# Patient Record
Sex: Female | Born: 1960 | Race: White | Hispanic: No | Marital: Single | Smoking: Current every day smoker
Health system: Southern US, Community
[De-identification: ages and names within clinical notes are randomized; demographics above are authoritative.]

## PROBLEM LIST (undated history)

## (undated) DIAGNOSIS — H9190 Unspecified hearing loss, unspecified ear: Secondary | ICD-10-CM

## (undated) DIAGNOSIS — Z974 Presence of external hearing-aid: Secondary | ICD-10-CM

## (undated) DIAGNOSIS — I10 Essential (primary) hypertension: Secondary | ICD-10-CM

---

## 2000-05-05 ENCOUNTER — Emergency Department (HOSPITAL_COMMUNITY): Admission: EM | Admit: 2000-05-05 | Discharge: 2000-05-06 | Payer: Self-pay | Admitting: Emergency Medicine

## 2001-01-09 ENCOUNTER — Emergency Department (HOSPITAL_COMMUNITY): Admission: EM | Admit: 2001-01-09 | Discharge: 2001-01-09 | Payer: Self-pay

## 2001-12-12 ENCOUNTER — Encounter: Payer: Self-pay | Admitting: *Deleted

## 2001-12-12 ENCOUNTER — Emergency Department (HOSPITAL_COMMUNITY): Admission: EM | Admit: 2001-12-12 | Discharge: 2001-12-12 | Payer: Self-pay | Admitting: *Deleted

## 2001-12-15 ENCOUNTER — Emergency Department (HOSPITAL_COMMUNITY): Admission: EM | Admit: 2001-12-15 | Discharge: 2001-12-15 | Payer: Self-pay | Admitting: *Deleted

## 2002-04-16 ENCOUNTER — Other Ambulatory Visit: Admission: RE | Admit: 2002-04-16 | Discharge: 2002-04-16 | Payer: Self-pay | Admitting: Family Medicine

## 2002-04-16 ENCOUNTER — Emergency Department (HOSPITAL_COMMUNITY): Admission: EM | Admit: 2002-04-16 | Discharge: 2002-04-16 | Payer: Self-pay | Admitting: Emergency Medicine

## 2002-04-16 ENCOUNTER — Encounter: Payer: Self-pay | Admitting: Emergency Medicine

## 2002-04-22 ENCOUNTER — Ambulatory Visit (HOSPITAL_COMMUNITY): Admission: RE | Admit: 2002-04-22 | Discharge: 2002-04-22 | Payer: Self-pay | Admitting: Family Medicine

## 2002-04-22 ENCOUNTER — Encounter: Payer: Self-pay | Admitting: Family Medicine

## 2002-04-22 ENCOUNTER — Encounter: Payer: Self-pay | Admitting: Emergency Medicine

## 2002-04-22 ENCOUNTER — Inpatient Hospital Stay (HOSPITAL_COMMUNITY): Admission: EM | Admit: 2002-04-22 | Discharge: 2002-04-24 | Payer: Self-pay | Admitting: Emergency Medicine

## 2002-04-24 ENCOUNTER — Encounter: Payer: Self-pay | Admitting: Infectious Diseases

## 2002-04-28 ENCOUNTER — Emergency Department (HOSPITAL_COMMUNITY): Admission: EM | Admit: 2002-04-28 | Discharge: 2002-04-28 | Payer: Self-pay | Admitting: Emergency Medicine

## 2002-04-28 ENCOUNTER — Encounter: Payer: Self-pay | Admitting: Emergency Medicine

## 2002-04-30 ENCOUNTER — Inpatient Hospital Stay (HOSPITAL_COMMUNITY): Admission: EM | Admit: 2002-04-30 | Discharge: 2002-05-06 | Payer: Self-pay | Admitting: Emergency Medicine

## 2002-04-30 ENCOUNTER — Encounter: Payer: Self-pay | Admitting: Emergency Medicine

## 2002-05-03 ENCOUNTER — Encounter (INDEPENDENT_AMBULATORY_CARE_PROVIDER_SITE_OTHER): Payer: Self-pay | Admitting: *Deleted

## 2002-05-04 ENCOUNTER — Encounter: Payer: Self-pay | Admitting: *Deleted

## 2002-05-04 ENCOUNTER — Encounter: Payer: Self-pay | Admitting: Family Medicine

## 2002-05-06 ENCOUNTER — Encounter: Admission: RE | Admit: 2002-05-06 | Discharge: 2002-05-06 | Payer: Self-pay | Admitting: Family Medicine

## 2003-02-10 ENCOUNTER — Inpatient Hospital Stay (HOSPITAL_COMMUNITY): Admission: EM | Admit: 2003-02-10 | Discharge: 2003-02-12 | Payer: Self-pay | Admitting: Psychiatry

## 2003-10-10 ENCOUNTER — Emergency Department (HOSPITAL_COMMUNITY): Admission: EM | Admit: 2003-10-10 | Discharge: 2003-10-10 | Payer: Self-pay | Admitting: Family Medicine

## 2003-10-11 ENCOUNTER — Emergency Department (HOSPITAL_COMMUNITY): Admission: EM | Admit: 2003-10-11 | Discharge: 2003-10-11 | Payer: Self-pay | Admitting: Family Medicine

## 2003-10-12 ENCOUNTER — Emergency Department (HOSPITAL_COMMUNITY): Admission: EM | Admit: 2003-10-12 | Discharge: 2003-10-12 | Payer: Self-pay | Admitting: Unknown Physician Specialty

## 2003-10-14 ENCOUNTER — Emergency Department (HOSPITAL_COMMUNITY): Admission: EM | Admit: 2003-10-14 | Discharge: 2003-10-14 | Payer: Self-pay | Admitting: Family Medicine

## 2003-10-25 ENCOUNTER — Emergency Department (HOSPITAL_COMMUNITY): Admission: EM | Admit: 2003-10-25 | Discharge: 2003-10-25 | Payer: Self-pay

## 2004-01-11 ENCOUNTER — Encounter: Admission: RE | Admit: 2004-01-11 | Discharge: 2004-01-11 | Payer: Self-pay | Admitting: Internal Medicine

## 2004-05-07 ENCOUNTER — Emergency Department (HOSPITAL_COMMUNITY): Admission: EM | Admit: 2004-05-07 | Discharge: 2004-05-07 | Payer: Self-pay | Admitting: Family Medicine

## 2004-05-09 ENCOUNTER — Emergency Department (HOSPITAL_COMMUNITY): Admission: EM | Admit: 2004-05-09 | Discharge: 2004-05-09 | Payer: Self-pay | Admitting: Family Medicine

## 2004-05-11 ENCOUNTER — Emergency Department (HOSPITAL_COMMUNITY): Admission: EM | Admit: 2004-05-11 | Discharge: 2004-05-12 | Payer: Self-pay | Admitting: Emergency Medicine

## 2004-05-15 ENCOUNTER — Ambulatory Visit: Payer: Self-pay | Admitting: Family Medicine

## 2004-05-23 ENCOUNTER — Emergency Department (HOSPITAL_COMMUNITY): Admission: EM | Admit: 2004-05-23 | Discharge: 2004-05-23 | Payer: Self-pay | Admitting: Family Medicine

## 2004-08-03 ENCOUNTER — Emergency Department (HOSPITAL_COMMUNITY): Admission: EM | Admit: 2004-08-03 | Discharge: 2004-08-03 | Payer: Self-pay | Admitting: Family Medicine

## 2004-09-10 ENCOUNTER — Emergency Department (HOSPITAL_COMMUNITY): Admission: EM | Admit: 2004-09-10 | Discharge: 2004-09-10 | Payer: Self-pay | Admitting: Family Medicine

## 2005-02-04 ENCOUNTER — Emergency Department (HOSPITAL_COMMUNITY): Admission: EM | Admit: 2005-02-04 | Discharge: 2005-02-04 | Payer: Self-pay | Admitting: Family Medicine

## 2005-02-06 ENCOUNTER — Emergency Department (HOSPITAL_COMMUNITY): Admission: EM | Admit: 2005-02-06 | Discharge: 2005-02-06 | Payer: Self-pay | Admitting: Emergency Medicine

## 2005-05-14 ENCOUNTER — Emergency Department (HOSPITAL_COMMUNITY): Admission: EM | Admit: 2005-05-14 | Discharge: 2005-05-14 | Payer: Self-pay | Admitting: Family Medicine

## 2005-06-24 ENCOUNTER — Emergency Department (HOSPITAL_COMMUNITY): Admission: EM | Admit: 2005-06-24 | Discharge: 2005-06-24 | Payer: Self-pay | Admitting: Emergency Medicine

## 2005-07-01 ENCOUNTER — Ambulatory Visit: Payer: Self-pay | Admitting: Internal Medicine

## 2005-07-13 ENCOUNTER — Emergency Department (HOSPITAL_COMMUNITY): Admission: EM | Admit: 2005-07-13 | Discharge: 2005-07-13 | Payer: Self-pay | Admitting: Family Medicine

## 2005-07-15 ENCOUNTER — Ambulatory Visit: Payer: Self-pay | Admitting: Internal Medicine

## 2005-08-15 ENCOUNTER — Emergency Department (HOSPITAL_COMMUNITY): Admission: EM | Admit: 2005-08-15 | Discharge: 2005-08-15 | Payer: Self-pay | Admitting: Family Medicine

## 2005-10-05 ENCOUNTER — Emergency Department (HOSPITAL_COMMUNITY): Admission: EM | Admit: 2005-10-05 | Discharge: 2005-10-05 | Payer: Self-pay | Admitting: Emergency Medicine

## 2005-10-22 ENCOUNTER — Emergency Department (HOSPITAL_COMMUNITY): Admission: EM | Admit: 2005-10-22 | Discharge: 2005-10-22 | Payer: Self-pay | Admitting: Family Medicine

## 2005-10-28 ENCOUNTER — Emergency Department (HOSPITAL_COMMUNITY): Admission: EM | Admit: 2005-10-28 | Discharge: 2005-10-28 | Payer: Self-pay | Admitting: Emergency Medicine

## 2006-07-08 ENCOUNTER — Telehealth: Payer: Self-pay | Admitting: Internal Medicine

## 2006-08-04 ENCOUNTER — Telehealth (INDEPENDENT_AMBULATORY_CARE_PROVIDER_SITE_OTHER): Payer: Self-pay | Admitting: *Deleted

## 2006-08-08 ENCOUNTER — Telehealth (INDEPENDENT_AMBULATORY_CARE_PROVIDER_SITE_OTHER): Payer: Self-pay | Admitting: *Deleted

## 2007-09-25 ENCOUNTER — Emergency Department (HOSPITAL_COMMUNITY): Admission: EM | Admit: 2007-09-25 | Discharge: 2007-09-25 | Payer: Self-pay | Admitting: Emergency Medicine

## 2008-05-21 ENCOUNTER — Emergency Department (HOSPITAL_COMMUNITY): Admission: EM | Admit: 2008-05-21 | Discharge: 2008-05-21 | Payer: Self-pay | Admitting: Family Medicine

## 2008-06-12 ENCOUNTER — Emergency Department (HOSPITAL_COMMUNITY): Admission: EM | Admit: 2008-06-12 | Discharge: 2008-06-12 | Payer: Self-pay | Admitting: Emergency Medicine

## 2008-11-26 ENCOUNTER — Emergency Department (HOSPITAL_BASED_OUTPATIENT_CLINIC_OR_DEPARTMENT_OTHER): Admission: EM | Admit: 2008-11-26 | Discharge: 2008-11-26 | Payer: Self-pay | Admitting: Emergency Medicine

## 2010-10-26 ENCOUNTER — Encounter: Payer: Self-pay | Admitting: Emergency Medicine

## 2010-10-26 ENCOUNTER — Other Ambulatory Visit: Payer: Self-pay | Admitting: Emergency Medicine

## 2010-10-26 ENCOUNTER — Ambulatory Visit
Admission: RE | Admit: 2010-10-26 | Discharge: 2010-10-26 | Disposition: A | Discharge status: Home / Self Care | Payer: Medicare Other | Source: Ambulatory Visit | Attending: Emergency Medicine | Admitting: Emergency Medicine

## 2010-10-26 ENCOUNTER — Inpatient Hospital Stay (INDEPENDENT_AMBULATORY_CARE_PROVIDER_SITE_OTHER)
Admission: RE | Admit: 2010-10-26 | Discharge: 2010-10-26 | Disposition: A | Discharge status: Home / Self Care | Payer: Medicare Other | Source: Ambulatory Visit | Attending: Emergency Medicine | Admitting: Emergency Medicine

## 2010-10-26 DIAGNOSIS — S82899A Other fracture of unspecified lower leg, initial encounter for closed fracture: Secondary | ICD-10-CM | POA: Insufficient documentation

## 2010-10-26 DIAGNOSIS — M25579 Pain in unspecified ankle and joints of unspecified foot: Secondary | ICD-10-CM

## 2010-10-26 DIAGNOSIS — I10 Essential (primary) hypertension: Secondary | ICD-10-CM | POA: Insufficient documentation

## 2010-10-27 DIAGNOSIS — S82899A Other fracture of unspecified lower leg, initial encounter for closed fracture: Secondary | ICD-10-CM

## 2010-11-02 NOTE — Discharge Summary (Signed)
NAMEMarland Kitchen  Shelby Mcdonald, Shelby Mcdonald NO.:  [id number]   MEDICAL RECORD NO.:  [id number]                   PATIENT TYPE:  INP   LOCATION:  5725                                 FACILITY:  [facility]   PHYSICIAN:  Nani Gasser, M.D.            DATE OF BIRTH:  Mar 21, 1961   DATE OF ADMISSION:  04/30/2002  DATE OF DISCHARGE:  05/06/2002                                 DISCHARGE SUMMARY   DISCHARGE DIAGNOSES:  1. Chronic right lower quadrant pain.  2. Constipation x5 weeks.  3. Bilateral neural hearing loss.  4. Depression.   DISCHARGE MEDICATIONS:  1. Colace 100 mg 1 p.o. b.i.d.  2. Simethicone 80 mg 1 p.o. q.d. to q.i.d. p.r.n. for gas.  3. Protonix 40 mg p.o. q.d.  4. Ultracet 2 tablets q.4-6h. p.r.n. pain.  5. MiraLax 1 teaspoon in 8 ounces of water q.d. p.r.n. constipation.   DISCHARGE DIET:  Should include high fiber and increased intake of water and  Metamucil.   FOLLOW UP:  The patient should follow up with Dr. Barbaraann Barthel at [facility] in  the next couple of weeks.   HOSPITAL COURSE:  1. Right lower quadrant pain.  The patient has been previously admitted for     the same problem, appendicitis was ruled out by CT, strictures were less     likely with CT.  The patient did have significant stool in the right side     of her colon.  The patient has been switched from her amitriptyline to     desipramine which has less anticholinergic effects and patient returned     with still intense pain.  Dr. Virginia Rochester from gastroenterology was consulted.     Dr. Virginia Rochester performed colonoscopy which was negative, though biopsy was     taken.  Also performed small bowel enterocolitis which was also negative.     He also did an abdominal ultrasound that did show a gallstone, but no     cholecystitis.  He also did a pelvic ultrasound indicating that the     patient did have a cyst on her right ovary that was 1.9 cm in size, but     this is less likely the cause of her intense abdominal  pain and     constipation.  After this extensive workup we consulted surgery, Dr.     Derrell Lolling, to consider a diagnostic laparoscopy and possible laparoscopy for     a cholecystectomy and appendectomy, but it was felt that he did not     advise surgical intervention at this time, per patient's history and     diagnostic findings.  Thus the patient was discharged home to follow up     with Dr. Luciana Axe, who would rather just see if she improves on her own     before considering surgery, but also decided to discontinue her     desipramine with a taper and see if this  improves her constipation.  She     was also placed on a bowel regimen including Colace, Metamucil, increased     high fiber diet and simethicone for gas, as well as MiraLax for severe     constipation.  2. Constipation x5 weeks.  See regimen above.  Also consider irritable bowel     syndrome, though the patient does not have intermittent periods of     diarrhea.  The patient could benefit from an antidepressant if she does     have irritable bowel, but would like to avoid anticholinergics.  3. Bilateral neural hearing loss.  The patient has a hearing aid on right.     No further workup of this problem during hospitalization.  4. Depression.  The patient is encouraged that she will do well off of her     desipramine.  I encouraged her that if she did feel like she was     beginning to have recurrent depressive symptoms, that she should advise     her physician.  5. Gastroesophageal reflux disease.  The patient should continue on her     Protonix.  Her H. Pylori was negative.                                               Nani Gasser, M.D.    CM/MEDQ  D:  05/06/2002  T:  05/06/2002  Job:  [id number]

## 2010-11-02 NOTE — Discharge Summary (Signed)
NAMEMarland Kitchen  Shelby Mcdonald, Shelby Mcdonald NO.:  [id number]   MEDICAL RECORD NO.:  [id number]                   PATIENT TYPE:  IPS   LOCATION:  0303                                 FACILITY:  [facility]   PHYSICIAN:  Jasmine Pang, M.D.              DATE OF BIRTH:  March 25, 1961   DATE OF ADMISSION:  02/10/2003  DATE OF DISCHARGE:  02/11/2003                                 DISCHARGE SUMMARY   BRIEF REASON FOR ADMISSION:  Patient was a 50 year old female who was  admitted on a voluntary basis due to major depression with suicidal  ideation.  She had overdosed on twenty 10 mg Ambiens, four to five Xanax and  two to three BC tablets; she states this was a suicide attempt, though she  reversed herself on this after admission.  Her grandson died on the weekend  of admission and her family had been arguing.  She denies any substance  abuse.  She has no homicidal ideation, no auditory or visual hallucination  and no delusions.   MEDICATIONS:  1. Effexor XR 75 mg two q.a.m.  2. Ambien 10 mg nightly.  3. Xanax 0.5 mg p.r.n.  4. Protonix 40 mg p.o. daily.   PAST MEDICAL HISTORY:  She has no significant medical problems.  She does  take some medication for hypertension and occasionally has shortness of  breath related to anxiety attacks.  Please see initial psychiatric  evaluation for more information.   PHYSICAL EXAM:  See physical exam done in the emergency room prior to  transfer to our unit.   LABORATORY TESTS:  Most of this was done in the emergency room, see these  records for details.  There were no significant abnormalities.  A  hypothyroid panel was done upon admission to our unit; it was grossly within  normal limits with a TSH of 2.145 (0.350 to 5.50).   HOSPITAL CARE SUMMARY:  Upon admission, the patient was begun on Xanax 0.5  mg, one now then repeat in 30 minutes if needed.  She was also placed on  Demerol 50 mg IM and Phenergan 25 mg IM now for a migraine  headache.  In  addition, she was placed on a nicotine patch 21 mg daily.  On February 11, 2003, her Demerol was discontinued.  She was placed on Imitrex 50 mg two  tablets now and may repeat p.r.n. headache in two hours, a maximum dose of  200 mg in 24 hours.  She was also placed on Fioricet one to two tablets  every (q) 4 hours as needed (p.r.n.) headache, not to exceed six tablets in  24 hours.  On February 11, 2003, she was started on Ativan 2 mg p.o. now for  severe anxiety.  She was begun on Seroquel 50 mg p.o. q.6 h. p.r.n.  agitation, atenolol 50 mg p.o. q.a.m., Ativan 2 mg one q.4 h. p.r.n.  agitation, Effexor XR  150 mg p.o. a.m. and 75 mg p.o. every 3 p.m., Protonix  EC 40 mg p.o. q.a.m.  The patient responded to these interventions well.  She was able to interact appropriately in unit therapeutic groups and  activities.  She recovered from her suicidal crisis and wanted badly to be  home with her family during this time after her grandson died; family was  supportive of this also and felt she would be safe.   MENTAL STATUS EXAMINATION AT TIME OF DISCHARGE:  Mood was less depressed.  She was initially angry that day, but then mood improved.  She was sad and  tearful when discussing the death of her grandson.  Otherwise, there was no  psychosis, no suicidal or homicidal ideation, cognitive was within normal  limits for her.  She had a family session with her boyfriend, who felt she  would be safe at home.  She continued to maintain that she had not  deliberately tried to harm herself prior to admission.  Her boyfriend said  he would be willing to monitor her closely.  She was scheduled for followup  at [facility], February 15, 2003, and intended to keep this appointment,  since she is already seen there.   DISCHARGE DIAGNOSES:   AXIS I:  Major depression, recurrent, severe.   AXIS II:  None.   AXES III:  1. Hypertension.  2. Migraine headaches.   AXIS IV:  Severe --  grandson's recent death.   AXIS V:  Global assessment of functioning -- current is 35, highest the past  70.   DISCHARGE MEDICATIONS:  The patient was discharged on:  1. Effexor XR 150 mg p.o. q.a.m. and 75 mg at 3 p.m.  2. Atenolol 50 mg daily.  3. Protonix 40 mg daily.  4. Imitrex as directed.   ACTIVITY LEVEL:  No restrictions.   DIET:  No restrictions.   POST-HOSPITAL CARE PLANS:  The patient will be seen at the [facility]  for followup therapy, February 15, 2003, at 1 p.m.  She will continue to be  seen by her outpatient psychiatric P.A., Dr. Vic Ripper.                                               Jasmine Pang, M.D.    Shelby Mcdonald  D:  04/03/2003  T:  04/04/2003  Job:  [id number]

## 2010-11-02 NOTE — H&P (Signed)
NAME:  Shelby Mcdonald, Shelby Mcdonald                        ACCOUNT NO.:  [id number]   MEDICAL RECORD NO.:  [id number]                   PATIENT TYPE:  INP   LOCATION:  5530                                 FACILITY:  [facility]   PHYSICIAN:  Wayne A. Sheffield Slider, M.D.                 DATE OF BIRTH:  [birth year]   DATE OF ADMISSION:  04/22/2002  DATE OF DISCHARGE:                                HISTORY & PHYSICAL   CHIEF COMPLAINT:  Hurting in right side.   HISTORY OF PRESENT ILLNESS:  Shelby Mcdonald is a 50 year old white female who  came in complaining of pain that initially started on the right side above  the hip approximately three months ago, and then two months, she said the  pain moved more into her right lower quadrant, and patient went to her  primary care doctor and was placed on Septra for a urinary tract infection.  The patient's pain persisted, and thus, she returned to her physician one  week ago and was placed on Cipro for evidently a second urinary tract  infection. The patient did come into the ED after being seen by her primary  care physician, and here had a negative UA, though she did have a CT which  showed a ruptured right ovarian cyst and possible ileus and cecum colon and  colon of full of stool. The patient says that she also returned to her own  private physician on today and was started on a different antibiotic of  which she did not know the name. The patient reports that she has also had  fever for the past three days and started feeling nauseated and vomiting  last night. The patient now describes her pain as diffuse across the  abdominal though still worse in the right lower quadrant. She describes it  as a sharp constant pain, at its worse 10/10. She says that she has also  been passing gas for the last six days while passing the flatus will have  spurts of liquid stool but has not had any truly formed or normal-sized  bowel movement for one week. The patient also complains of  having difficulty  initiating her urinary stream, says that she has to massage her lower  abdomen to help herself urinate. The patient was seen by surgery in the  emergency department, and they felt that she did not have a surgical  abdomen. The patient has also had a pelvic exam with a GC and Chlamydia at  her primary care physician which the patient reports as normal and negative.   REVIEW OF SYMPTOMS:  SKIN:  No rash. EYES:  No complaints of blurry vision.  ENT:  Has had a sore throat and blisters in the mouth which she says are  secondary to antibiotics. GENERAL:  The patient says she has gained 9 pounds  in the past two and a half weeks. NEUROLOGICAL:  The patient  says that she  has headaches daily for which she takes 65 to 48 Goody Powders a day.  GENITOURINARY:  No pain with urinating and no burning. Does report having a  cyst on her right ovary. GASTROINTESTINAL:  Occasional problems with  constipation for which she takes MiraLax.   MEDICATIONS:  1. Amitriptyline 100 mg p.o. q.h.s.  2. Protonix 40 mg p.o. q.d.  3. Lidocaine mouthwash for the mouth ulcers.  4. Cipro b.i.d.  5. A new antibiotic for which she did not know the name t.i.d.  6. MiraLax p.r.n.  7. B.C. Powder for headache.   ALLERGIES:  No known drug allergies.   PAST MEDICAL HISTORY:  Has hearing loss bilaterally. She has had nerve  deafness since she was 50 years old. The patient does wear a hearing aid on  the right. She has gastroesophageal reflux disease and depression.   PAST SURGICAL HISTORY:  The patient has had a bilateral tubal ligation.   SOCIAL HISTORY:  The patient lives with her fiance and her daughter. She  stays at home and helps take care of her disabled  husband, and recently,  her mother fell and broke her hip, and she has been taking care of her as  well. The patient says she smokes half a pack per day and drinks no alcohol  and takes no other drugs.   FAMILY HISTORY:  The patient is  adopted, and she does not know her family  history.   PHYSICAL EXAMINATION:  VITAL SIGNS:  Temperature 96.7, blood pressure  147/103 which a second repeat was 138/86, pulse was from 96 to 82,  respirations 22, and oxygen saturations 98 to 99%.  GENERAL:  The patient was well-developed, well-nourished. Could speak  clearly. Does have a hearing aid on the right.  HEENT:  Head is atraumatic and normocephalic. Eyes:  Extraocular movements  intact. Pupils are equal, round, and reactive. White sclerae. Nose:  Without  drainage. Oropharynx:  Clear.  HEART:  Regular, rate, and rhythm with no murmur.  CHEST:  Clear to auscultation bilaterally.  ABDOMEN:  Soft, positive bowel sounds, tender in all four quadrants but no  hepatosplenomegaly.  BACK:  Spine is straight. No CVA tenderness.  NEUROLOGICAL:  Cranial nerves II-XII are intact. Gross movement intact.  EXTREMITIES:  With no edema.   LABORATORY DATA:  Lipase is 33, amylase 83, sodium 135, potassium 4.3,  chloride 103, bicarb 24, BUN 7, creatinine 0.7, glucose of 95. White count  6.2, hemoglobin 12.9, hematocrit 35.6, platelets 379, __________  59%  neutrophils. UA was negative. ESR was 31. Total protein 6.9, albumin 3.6,  AST 26, ALT 19, alkaline phosphatase was 73, total bilirubin 0.6. Abdominal  CT showed a large amount of feces in the cecum and the ascending colon and a  normal appendix.   ASSESSMENT/PLAN:  1. The patient is a 50 year old white female with recent history of     constipation and on narcotic pain management and anticholinergic for     depression. Possibilities include irritable bowel with her intermittent     constipation and abdominal pain, but the pain appears to be much more     acute. The patient may also have some pain from evidence of her right     ovarian cyst rupture, but there were changes on CT that were noted. The    patient will be ruled out for a tubal pregnancy. She had a negative test     one week ago,  but we  will retest her today. It was also thought of     recurrent UTI since she has had a recent history of UTI. Her UA is     negative. The patient has been on antibiotics recently. The patient also     has had appendicitis ruled out since there was no indication on CT exam     and no elevated white blood count and no fever since admission. It is     most likely that patient has ileus secondary to narcotic use and     anticholinergics. She will be given GoLYTELY p.o., and if she cannot     tolerate this, we will place a NG to give the GoLYTELY. We will use a     nonnarcotic, Toradol, for pain. We will at this time hold her Elavil     because of anticholinergic affects. We will consider started her on     Desipramine in its place since it has anticholinergic affects. We will     also give her Phenergan for nausea.  2. Urinary hesitancy. Due to the Elavil. Consider changing to desipramine.     We will also check a post void residual.  3.     Gastroesophageal reflux disease. Continue Protonix IV and encourage the     patient to discontinue using B.C. Powder for her headache. We will also     check a H. pylori.  4. Hearing loss. Will need no management in house.     Nani Gasser, M.D.                  Wayne A. Sheffield Slider, M.D.    CM/MEDQ  D:  04/22/2002  T:  04/23/2002  Job:  [id number]   cc:   Jillyn Hidden A. Rankin, M.D.  [address]  [place]  [place] [address]  Fax: [phone]

## 2010-11-02 NOTE — H&P (Signed)
NAMEMarland Kitchen  Shelby Mcdonald, Shelby Mcdonald                        ACCOUNT NO.:  [id number]   MEDICAL RECORD NO.:  [id number]                   PATIENT TYPE:  INP   LOCATION:  5725                                 FACILITY:  [facility]   PHYSICIAN:  Emmit Alexanders, M.D.                      DATE OF BIRTH:  Apr 12, 1961   DATE OF ADMISSION:  04/30/2002  DATE OF DISCHARGE:                                HISTORY & PHYSICAL   CHIEF COMPLAINT:  Abdominal pain.   HISTORY OF PRESENT ILLNESS:  This is a 50 year old white female who was  admitted to the hospital roughly one week ago with similar complaints of  abdominal pain.  The pain is located in the right lower quadrant.  During  her initial hospitalization, work up included a CT scan, which showed cecum  full of stool.  She was treated with aggressive laxatives with frequent  bowel movements and improvement in her symptoms.  She was therefore  discharged to home on a bowel regimen and instruction to eat a clear  liquid/soft diet.  For the first two days after discharge, the patient  seemed to do well.  However, after two more days, the patient began starting  to have abdominal pain again.  The pain is described as if somebody stabbed  her in the right lower quadrant.  The pain is worse with coughing and  movement.  Associated with this, the patient has been having hyperactive  bowel sounds.  She has had some vomiting as well as some small amounts of  diarrhea.  She also complains of abdominal distention.  She also states she  has been feeling hot and cold chills.  She denies any hematemesis.  Denies  any hematochezia or melena.  Denies any vaginal itching or burning.  Denies  any dysuria.  Denies any hematuria.   PAST MEDICAL HISTORY:  For past medical history please see prior history and  physical.   For medications please see prior discharge summary.   ALLERGIES:  No known drug allergies.   For social history and family history please see prior history and  physical.   VITAL SIGNS:  Temperature is 97.5.  Pulse is 85.  Respiratory rate is 16.  Blood pressure is 102/52.  Oxygen saturation is 97% on room air.  In  general, this is a average adult white female in no acute distress.  She is  pleasant, with her partner today.  HEENT:  Pupils equal, round, and reactive  to light.  No icterus or conjuctivitis.  Extraocular movements are intact.  Oropharynx is without erythema or exudate.  She has dry mucous membranes.  Her lungs are clear to auscultation bilaterally.  No wheezes or rhonchi.  Good air movement.  Her heart, regular rate and rhythm without murmurs.  Her  abdomen, soft, nondistended, with normal to hyperactive bowel sounds.  She  had diffuse tenderness to palpation,  especially of the right lower quadrant.  She had no organomegaly.  For extremity exam, she had no edema.  She had 2+  peripheral pulses.  She moved all four extremities well.   For laboratory data:  1. CBC:  White blood cell count is 7.5.  Hemoglobin is 11.7.  Hematocrit is     35.4.  Platelet count is 269.  Differential was normal.  2. Complete metabolic panel:  Sodium is 139.  Potassium 3.8.  Chloride is     102. CO2 is 29. Glucose is 119.  BUN is 8.  Creatinine is 0.9.  Bilirubin     is 0.4.  Alkaline phosphatase is 72.  SGOT is 26.  SGPT is 29.  Total     protein is 6.7.  Albumin is 3.5.  Calcium is 8.8.  3. Lipase is 41.  4. Urinalysis is completely normal.  5. Abdominal x-ray series shows nonspecific bowel gas pattern with a single     small bowel loop left upper quadrant demonstrating slightly thickened     folds without obvious obstruction or free intraperitoneal air.   ASSESSMENT/PLAN:  This is a 50 year old white female with recurrent right  lower quadrant abdominal pain of questionable etiology.  Given that she has  had two prior CT scans that were normal besides constipation, other causes  including appendicitis, ileus or obstruction, malignancy,  pancreatitis,  gallbladder disease, are all unlikely.  There was mention of a right ovarian  cyst, for which we will obtain a transvaginal ultrasound for further  evaluation.  If this is negative, a Gastroenterology consult will be  obtained to perform a colonoscopy.  For now, the patient will be kept NPO,  hydrated intravenously with D5-1/2 normal saline, with 12 mEq potassium  running at 100 cc per hour.  She may have Phenergan as needed for nausea.  I  will give her Protonix I.V.  Pain can be controlled on Toradol, as narcotics  should be avoided for possible GI etiology.                                               Emmit Alexanders, M.D.    DV/MEDQ  D:  04/30/2002  T:  04/30/2002  Job:  [id number]

## 2010-11-02 NOTE — Discharge Summary (Signed)
NAMEMarland Kitchen  Shelby Mcdonald, Shelby Mcdonald NO.:  [id number]   MEDICAL RECORD NO.:  [id number]                   PATIENT TYPE:  INP   LOCATION:  5530                                 FACILITY:  [facility]   PHYSICIAN:  Dalbert Mayotte, M.D.                 DATE OF BIRTH:  Oct 18, 1960   DATE OF ADMISSION:  04/22/2002  DATE OF DISCHARGE:  04/24/2002                                 DISCHARGE SUMMARY   DISCHARGE DIAGNOSES:  1. Abdominal pain.  2. Constipation.  3. Depression.  4. Gastroesophageal reflux disease.  5. Hearing loss.   DISCHARGE MEDICATIONS:  1. MiraLax one teaspoon in 8 ounces of water p.o. q day p.r.n. constipation.  2. Colace 100 mg one tablet p.o. b.i.d.  3. Desipramine 100 mg p.o. every day.  Start first week with 1/2 tablet p.o.     every day, to increase as directed.  4. Simethicone 80 mg one tablet p.o. q.i.d. p.r.n. gas.  5. Ultracet two tablets p.o. q.4-6h. p.r.n. pain.  6. Protonix 40 mg p.o. every day.   HISTORY OF PRESENT ILLNESS:  The patient is a 50 year old white female who  presented with a three month history of intermittent right lower quadrant  abdominal pain that had sudden worsening on the day of admission.   LABORATORY DATA:  Labs on admission:  Lipase 33, amylase 83, BNP was within  normal limits, LFTs were normal.  White count 6.2.  UA was negative.  Sedimentation rate 31.  Abdominal CT showed a large amount of feces in the  cecum and ascending colon.  Appendix was normal.   HOSPITAL COURSE:  1. Abdominal pain.  Clearly, differential diagnosis was broad in this 16-     year-old lady, but given the findings on the CT scan, abdominal film did     show a diffuse stool pattern in the colon.  For this reason, she was     started on a colonic clean-out regimen with an osmotic cathartic, would     go lightly.  She did have good result with several large stools with     this, until her stool was clear liquid.  She did have great relief of her    pain as well.  She was kept NPO and then started back on clear liquids     which she tolerated well.  Her diet was advanced to a regular diet and     with the exception on the morning of discharge, she did have a brief     episode of return of her pain after eating breakfast, but other than     that, she had remained pain-free.  Given that her pain was well-     controlled, it was felt that she was stable for discharge home with     followup with her primary care physician in one to two weeks.  It was  emphasized the importance of continuing on a bowel regimen and also     increasing her p.o. water intake as she had not been drinking much water     at all, along with increasing her fiber intake.  She was in agreement     with this plan.  She was given a prescription for Ultracet to be taken if     she were to have cramping in the future, just to be used on an as needed     basis.  2. Depression.  She was originally on Elavil and this was felt secondary to     anticholinergic side effects which may be contributing to her     constipation.  For this, was switched over to Desipramine 100 mg.  She     was instructed to start at 50 mg a day for the first week and then     increase to 100 mg every day thereafter.  She will followup with regular     primary care physician for further increases in her dose.  3. Gastroesophageal reflux disease.  She was instructed to continue her     Protonix 40 mg a day.  There were no changes made in this regimen.   DISCHARGE INSTRUCTIONS:  Pain Management:  She was instructed to take  Ultracet as directed, but if she were to develop fevers, chills or worsening  abdominal pain, she should let her primary care physician know.  Activity:  As tolerated.  Diet:  Increase to a high fiber diet as tolerated with eight  8 ounce glasses of water a day.   SPECIAL INSTRUCTIONS:  She was instructed to discontinue her Elavil prior to  starting Desipramine which will  take the place of Elavil.   FOLLOWUP:  She needs to follow up with Jillyn Hidden A. Rankin, M.D. at [facility]  in next one to two weeks.                                                 Dalbert Mayotte, M.D.    AS/MEDQ  D:  04/24/2002  T:  04/26/2002  Job:  [id number]   cc:   Jillyn Hidden A. Rankin, M.D.  [address]  [place]  [place] [address]  Fax: [phone]

## 2010-11-02 NOTE — Op Note (Signed)
   NAMEMarland Kitchen  EILEE, SCHADER NO.:  [id number]   MEDICAL RECORD NO.:  [id number]                   PATIENT TYPE:  INP   LOCATION:  5725                                 FACILITY:  [facility]   PHYSICIAN:  Georgiana Spinner, M.D.                 DATE OF BIRTH:  03-Feb-1961   DATE OF PROCEDURE:  05/03/2002  DATE OF DISCHARGE:                                 OPERATIVE REPORT   PROCEDURE PERFORMED:  Colonoscopy.   ENDOSCOPIST:  Georgiana Spinner, M.D.   INDICATIONS FOR PROCEDURE:  Rule out Crohn's disease.   ANESTHESIA:  Demerol 100 mg, Versed 10 mg.   DESCRIPTION OF PROCEDURE:  With the patient mildly sedated in the left  lateral decubitus position, the Olympus video colonoscope was inserted in  the rectum and passed under direct vision to the cecum, identified by the  ileocecal valve and appendiceal orifice, both of which were photographed.  We entered into the terminal ileum.  The small bowel appeared grossly  normal.  It was photographed and biopsied.  From this point the colonoscope  was slowly withdrawn taking circumferential views of the terminal ileum and  colonic mucosa until we reached the rectum which appeared normal on direct  and retroflex view.  The endoscope was straightened and withdrawn.  The  patient's vital signs and pulse oximeter remained stable.  The patient  tolerated the procedure well without apparent complications.   FINDINGS:  Essentially negative colonoscopic examination including terminal  ileum.   PLAN:  Enteroclysis to evaluate small bowel lesion seen previously.                                                  Georgiana Spinner, M.D.    GMO/MEDQ  D:  05/03/2002  T:  05/03/2002  Job:  [id number]   cc:   Leighton Roach McDiarmid, M.D.  [address] [place]  [place] [address]  Fax: [phone]

## 2010-11-02 NOTE — Consult Note (Signed)
NAMEMarland Mcdonald  Shelby, Mcdonald                        ACCOUNT NO.:  [id number]   MEDICAL RECORD NO.:  [id number]                   PATIENT TYPE:  INP   LOCATION:  5725                                 FACILITY:  [facility]   PHYSICIAN:  Angelia Mould. Derrell Lolling, M.D.             DATE OF BIRTH:  1960-11-08   DATE OF CONSULTATION:  05/05/2002  DATE OF DISCHARGE:                                   CONSULTATION   REASON FOR CONSULTATION:  Evaluation of abdominal pain.   HISTORY OF PRESENT ILLNESS:  This is a 50 year old white female who has a  three to four week history of constipation, radiographically demonstrated  right sided fecal impaction, right lower quadrant pain, and intermittent  abdominal distention and vomiting by history. She has been seen by Dr. Ovidio Kin in the emergency room who did not feel she had a surgical problem.   She is now currently admitted for the second time by the [facility]  teaching service for workup. She has had an extensive workup including two  CTs which were negative except for a gallstone, a gallbladder ultrasound  which shows a solitary gallstone, but otherwise is normal. Shows no signs of  inflammation or common bile duct dilatation. She has had all routine lab  work which is normal; specifically, no leukocytosis, no abnormal liver  function tests, no evidence of pancreatitis or urinary tract infection. She  has had a colonoscopy with biopsy of the terminal ileum which was normal.  She has had a small-bowel follow through which was normal. She has had a  pelvic ultrasound which shows a small right ovarian cyst.   The patient and her physicians are frustrated by the lack of diagnosis. The  patient is currently doing okay, does not seem to be having any pain today,  has been eating and walking around the halls with her fiance, smoking  cigarettes. The patient is frustrated and does not want to have to continue  rehospitalizations.   PAST MEDICAL HISTORY:   She has had C-section and bilateral tubal ligation.  She has hearing loss and has hearing aids. She has gastroesophageal reflux  disease. She has depression.   CURRENT MEDICATIONS:  1. Amitriptyline 100 mg p.o. at bedtime.  2. Protonix 40 mg q.d.  3. Lidocaine mouthwash for mouth ulcers.  4. Cipro b.i.d.  5. MiraLax p.r.n.  6. B.C. Powders for headache.   ALLERGIES:  None known.   SOCIAL HISTORY:  The patient is unemployed. She lives with her fiance and  her daughter. She has been taking care of her mother is also disabled. She  smokes cigarettes but denies use of alcohol or other drugs.   FAMILY HISTORY:  The patient is adopted and has no family history.   REVIEW OF SYMPTOMS:  All systems reviewed and noncontributory except as  described above.   PHYSICAL EXAMINATION:  GENERAL:  Healthy appearing middle-aged white female  who is  friendly and active and in no distress whatsoever. She is afebrile.  Her vital signs are normal.  HEENT:  Sclerae are clear. Extraocular movements intact. Oropharynx is  clear.  NECK:  Supple, nontender, no masses, no bruit, no jugular venous distention.  HEART:  Regular, rate, and rhythm; no murmur.  LUNGS:  Clear to auscultation.  ABDOMEN:  Completely benign, soft, nontender, no masses, no hernia, no  distention. Liver and spleen are not enlarged.  BACK:  Straight. No CVA tenderness.  EXTREMITIES:  No edema, good pulses.  NEUROLOGICAL:  Grossly within normal limits.   IMPRESSION:  1. Intermittent right lower quadrant pain, constipation, and abdominal     distention, etiology is unknown. I suspect that she may have a motility     disorder although I am not sure.  2. Gallstones. She does not have a clinical presentation consistent with     acute cholecystitis, and I would not treat her as such.  3. I doubt that she has appendicitis.   PLAN:  1. I do not advise surgical intervention at this time.  2. Diagnostic laparoscopy and possible  laparoscopy cholecystectomy and     laparoscopic appendectomy could be considered as an empiric diagnostic     test; however, given the exhausted workup to date, there is a good chance     she would not benefit from this surgery, and so I advised against that at     this time. It remains an option in the future but is not advised at this     time.  3. I will see the patient again at your request.                                               Angelia Mould. Derrell Lolling, M.D.    HMI/MEDQ  D:  05/05/2002  T:  05/05/2002  Job:  [id number]

## 2010-11-02 NOTE — Consult Note (Signed)
NAMEMarland Kitchen  Shelby Mcdonald, Shelby Mcdonald                        ACCOUNT NO.:  [id number]   MEDICAL RECORD NO.:  [id number]                   PATIENT TYPE:  EMS   LOCATION:  MINO                                 FACILITY:  [facility]   PHYSICIAN:  Sandria Bales. Ezzard Standing, M.D.               DATE OF BIRTH:  Jul 06, 1960   DATE OF CONSULTATION:  04/16/2002  DATE OF DISCHARGE:                                   CONSULTATION   REASON FOR CONSULTATION:  Abdominal pain, etiology unclear.   HISTORY OF PRESENT ILLNESS:  This is a 50 year old white female who has no  identified primary medical doctor, is usually seen through [facility] for  medical problems.  About a month and a half ago she had some very vague  flank and back pain, was seen at [facility], where she was treated for a  kidney infection.  She also was worked up for hepatitis and mononucleosis.  These tests were negative.  She has had three or four kidney/urinary tract  infections before.  It sounds like she has had some kind of piddly symptoms  for the last four to six weeks; however, for the last day or day and a half  she has had increasing right lower quadrant pain.  She has had some nausea  with this, no change in her bowel habits.   She carries a diagnosis of gastroesophageal reflux disease, though she has  not had an endoscopy or upper GI in many years.  She said the patient did  have an endoscopy about 1980, some 23 years ago.  She sees no  gastroenterologist on any regular basis.  She denies a history of liver  disease, pancreatic disease, or chronic change in bowel habits.   ALLERGIES:  She is allergic to CODEINE, which makes her sick.  She also  claims allergies to [facility] and SEPTRA, but what she gets is a yeast  infection in her mouth and her mouth blisters.  This truly is not an  allergy, but she just gets a bacterial overgrowth.   MEDICATIONS:  1. Amitriptyline 100 mg nightly for sleep and depression.  2. She takes Protonix for  gastroesophageal reflux disease.   PAST MEDICAL HISTORY/REVIEW OF SYSTEMS:  NEUROLOGIC:  She has had no history  of seizure or loss of consciousness.  She has had very poor hearing since  age 67.  She has actually been down to [facility] for evaluation of her  hearing and cochlear implants.  Apparently her hearing loss came from a  febrile episode when she was young. PULMONARY:  She smokes a half-pack of  cigarettes a day and knows this is bad for her health.  No history of  pneumonia or tuberculosis.  CARDIAC:  No history of heart disease, chest  pain, or hypertension.  GASTROINTESTINAL:  See history of present illness.  UROLOGIC:  Again, she has had these repeated urinary tract infections,  though she denies  any history of kidney stones.  GYNECOLOGIC:  She is a  gravida 2, para 2, and her children are 69 and 9.  The last menstrual period  was three to 3-1/2 weeks ago.  She has had a tubal ligation in the past, and  she had a C-section with the last child.  Her only other injury was a right  leg fracture in 1985, when she had a horse riding injury.   SOCIAL HISTORY:  She is unemployed, which she blames on her hearing  primarily.   PHYSICAL EXAMINATION:  VITAL SIGNS:  Her temperature is 97.3, her blood  pressure is 133/89, pulse is 120, respirations 20.  With her is her fiance, though he has been her fiance for 24 years.  They  have no designated date for getting married.  HEENT:  Unremarkable.  NECK:  Supple.  I feel no thyromegaly.  She has no supraclavicular or  axillary adenopathy.  BREASTS:  Both of her breasts are unremarkable for mass or lesion.  CARDIAC:  Her heart has a regular rate and rhythm without murmur or rub.  CHEST:  Her lungs are symmetric to auscultation.  ABDOMEN:  Decreased but present bowel sounds.  She is sort of tender in the  right lower quadrant, almost in the right groin area.  I feel no mass, no  hernia.  She has no real rebound and maybe minimal  guarding.  EXTREMITIES:  Good strength in the upper and lower extremities.  NEUROLOGIC:  Grossly intact.   LABORATORY DATA:  The labs that I have show a white blood count of 21,000,  hemoglobin of 12.8, hematocrit 37.  Her sodium is 136, potassium 4.1,  chloride of 105, CO2 of 23, glucose of 92, BUN of 9, creatinine is 0.7.  Her  urinalysis is negative with a negative leukocyte esterase, and she has, I  think,  lipase pending but it is not back yet.   She had a CT scan obtained by Dr. Estell Harpin in the emergency room, and I  reviewed this with Shelby Mcdonald, M.D., and they did the right images.  She  does have a solitary gallstone but no thickening of the gallbladder wall,  and her symptoms are well away from her gallbladder.  The appendix is really  not seen, but there is no inflammatory mass around her cecum or cecal  border.  She has no thickening of her small bowel.  She does have small  bilateral ovarian cysts with some free fluid in her pelvis consistent with a  possible ruptured cyst.   IMPRESSION:  1. Right lower quadrant abdominal pain with leukocytosis.  Though her white     blood count seems high, the only explanation I can give for this right     now is a possible ruptured ovarian cyst.  I do not see any other obvious     infectious source for what is going on.  I have spoken with Dr. Estell Harpin,     and I have recommended him putting her on antibiotics and pain medicine     and have re-evaluated at [facility] in three to five days.  If she is no     better, she may need a further evaluation.  2. Solitary gallstone.  I do not think this is causing her acute symptoms,     but it is causing chronic symptoms.  I think it depends on how she     improves.  I did suggest she possibly  see a GI doctor, since she has a     history of gastroesophageal reflux disease, to make sure that is all okay     before considering a surgery, which may or may not be necessary.  3. Depression.  4.  Smokes cigarettes. 5. Decreased hearing since febrile illness when she was young.                                               Sandria Bales. Ezzard Standing, M.D.    DHN/MEDQ  D:  04/16/2002  T:  04/17/2002  Job:  [id number]   cc:   Dala Dock

## 2011-03-21 LAB — RAPID URINE DRUG SCREEN, HOSP PERFORMED
Amphetamines: NOT DETECTED
Barbiturates: NOT DETECTED
Benzodiazepines: NOT DETECTED
Cocaine: NOT DETECTED
Opiates: NOT DETECTED
Tetrahydrocannabinol: NOT DETECTED

## 2011-03-21 LAB — POCT I-STAT, CHEM 8
BUN: 8 mg/dL (ref 6–23)
Calcium, Ion: 1.08 mmol/L — ABNORMAL LOW (ref 1.12–1.32)
Chloride: 98 mEq/L (ref 96–112)
Creatinine, Ser: 0.8 mg/dL (ref 0.4–1.2)
Glucose, Bld: 108 mg/dL — ABNORMAL HIGH (ref 70–99)
HCT: 40 % (ref 36.0–46.0)
Hemoglobin: 13.6 g/dL (ref 12.0–15.0)
Potassium: 3.6 mEq/L (ref 3.5–5.1)
Sodium: 132 mEq/L — ABNORMAL LOW (ref 135–145)
TCO2: 25 mmol/L (ref 0–100)

## 2011-03-21 LAB — URINALYSIS, ROUTINE W REFLEX MICROSCOPIC
Bilirubin Urine: NEGATIVE
Glucose, UA: NEGATIVE mg/dL
Ketones, ur: NEGATIVE mg/dL
Leukocytes, UA: NEGATIVE
Nitrite: NEGATIVE
Protein, ur: NEGATIVE mg/dL
Specific Gravity, Urine: 1.006 (ref 1.005–1.030)
Urobilinogen, UA: 0.2 mg/dL (ref 0.0–1.0)
pH: 7 (ref 5.0–8.0)

## 2011-03-21 LAB — CBC
HCT: 35.4 % — ABNORMAL LOW (ref 36.0–46.0)
Hemoglobin: 12 g/dL (ref 12.0–15.0)
MCHC: 33.9 g/dL (ref 30.0–36.0)
MCV: 88.7 fL (ref 78.0–100.0)
Platelets: 394 10*3/uL (ref 150–400)
RBC: 3.99 MIL/uL (ref 3.87–5.11)
RDW: 17.5 % — ABNORMAL HIGH (ref 11.5–15.5)
WBC: 11.5 10*3/uL — ABNORMAL HIGH (ref 4.0–10.5)

## 2011-03-21 LAB — URINE CULTURE: Colony Count: >=100000

## 2011-03-21 LAB — DIFFERENTIAL
Basophils Absolute: 0 10*3/uL (ref 0.0–0.1)
Basophils Relative: 0 % (ref 0–1)
Eosinophils Absolute: 0.1 10*3/uL (ref 0.0–0.7)
Eosinophils Relative: 1 % (ref 0–5)
Lymphocytes Relative: 13 % (ref 12–46)
Lymphs Abs: 1.5 10*3/uL (ref 0.7–4.0)
Monocytes Absolute: 1.1 10*3/uL — ABNORMAL HIGH (ref 0.1–1.0)
Monocytes Relative: 9 % (ref 3–12)
Neutro Abs: 8.7 10*3/uL — ABNORMAL HIGH (ref 1.7–7.7)
Neutrophils Relative %: 76 % (ref 43–77)

## 2011-03-21 LAB — D-DIMER, QUANTITATIVE: D-Dimer, Quant: 0.48 ug/mL-FEU (ref 0.00–0.48)

## 2011-03-21 LAB — URINE MICROSCOPIC-ADD ON

## 2011-03-21 LAB — ETHANOL: Alcohol, Ethyl (B): <5 mg/dL (ref 0–10)

## 2011-04-29 ENCOUNTER — Encounter: Payer: Self-pay | Admitting: *Deleted

## 2011-04-29 ENCOUNTER — Emergency Department
Admit: 2011-04-29 | Discharge: 2011-04-29 | Disposition: A | Discharge status: Home / Self Care | Payer: Medicare Other | Attending: Emergency Medicine | Admitting: Emergency Medicine

## 2011-04-29 ENCOUNTER — Emergency Department (INDEPENDENT_AMBULATORY_CARE_PROVIDER_SITE_OTHER)
Admission: EM | Admit: 2011-04-29 | Discharge: 2011-04-29 | Disposition: A | Discharge status: Home / Self Care | Payer: Medicare Other | Source: Home / Self Care | Attending: Emergency Medicine | Admitting: Emergency Medicine

## 2011-04-29 DIAGNOSIS — S20229A Contusion of unspecified back wall of thorax, initial encounter: Secondary | ICD-10-CM

## 2011-04-29 DIAGNOSIS — M549 Dorsalgia, unspecified: Secondary | ICD-10-CM

## 2011-04-29 DIAGNOSIS — S300XXA Contusion of lower back and pelvis, initial encounter: Secondary | ICD-10-CM

## 2011-04-29 DIAGNOSIS — S335XXA Sprain of ligaments of lumbar spine, initial encounter: Secondary | ICD-10-CM

## 2011-04-29 DIAGNOSIS — S39012A Strain of muscle, fascia and tendon of lower back, initial encounter: Secondary | ICD-10-CM

## 2011-04-29 HISTORY — DX: Presence of external hearing-aid: Z97.4

## 2011-04-29 HISTORY — DX: Unspecified hearing loss, unspecified ear: H91.90

## 2011-04-29 HISTORY — DX: Essential (primary) hypertension: I10

## 2011-04-29 LAB — POCT CBC W AUTO DIFF (K'VILLE URGENT CARE)

## 2011-04-29 LAB — POCT URINALYSIS DIPSTICK
Bilirubin, UA: NEGATIVE
Glucose, UA: NEGATIVE
Ketones, UA: NEGATIVE
Leukocytes, UA: NEGATIVE
Nitrite, UA: NEGATIVE
Protein, UA: NEGATIVE
Spec Grav, UA: <=1.005 (ref 1.005–1.03)
Urobilinogen, UA: 0.2 (ref 0–1)
pH, UA: 5.5 (ref 5–8)

## 2011-04-29 MED ORDER — CYCLOBENZAPRINE HCL 10 MG PO TABS: 10.0000 mg | ORAL_TABLET | Freq: Two times a day (BID) | ORAL | Status: AC | PRN

## 2011-04-29 MED ORDER — ETODOLAC 500 MG PO TABS: 500.0000 mg | ORAL_TABLET | Freq: Two times a day (BID) | ORAL | Status: AC

## 2011-04-29 NOTE — ED Notes (Signed)
Patient c/o low back pain x 2 weeks, unknown cause. Pain radiated to right hip and right leg. She also presents with bruising the started 3 days ago. It starts on her right glute and speards up her right side of her back. Unknown cause.

## 2011-04-29 NOTE — ED Provider Notes (Signed)
History     CSN: [id number] Arrival date & time: 04/29/2011  1:34 PM   First MD Initiated Contact with Patient 04/29/11 1406      Chief Complaint  Patient presents with  . Back Pain    (Consider location/radiation/quality/duration/timing/severity/associated sxs/prior treatment) Patient is a 50 y.o. female presenting with back pain. The history is provided by the patient.  Back Pain  This is a new problem. The current episode started more than 1 week ago (2 weeks ago). The problem occurs constantly. The problem has been gradually worsening. The pain is associated with no known injury. The pain is present in the lumbar spine. The quality of the pain is described as stabbing. The pain radiates to the right thigh (Right gluteal). The pain is at a severity of 8/10. The symptoms are aggravated by bending, twisting and certain positions. The pain is the same all the time. Associated symptoms include weakness (R thigh). Pertinent negatives include no chest pain, no fever, no weight loss, no abdominal pain, no abdominal swelling, no bowel incontinence, no perianal numbness, no bladder incontinence, no dysuria and no pelvic pain. Numbness: R thigh. Treatments tried: massage. The treatment provided no relief. Risk factors: perimenopause.    Past Medical History  Diagnosis Date  . Hypertension   . Hearing aid worn   . Hearing disorder     Past Surgical History  Procedure Date  . Cesarean section     Family History  Problem Relation Age of Onset  . Adopted: Yes    History  Substance Use Topics  . Smoking status: Current Everyday Smoker  . Smokeless tobacco: Not on file  . Alcohol Use: No    OB History    Grav Para Term Preterm Abortions TAB SAB Ect Mult Living                  Review of Systems  Constitutional: Negative for fever, chills, weight loss and unexpected weight change.  HENT: Negative.   Eyes: Negative.   Respiratory: Negative.  Negative for cough and shortness of  breath.   Cardiovascular: Negative for chest pain, palpitations and leg swelling.  Gastrointestinal: Negative.  Negative for abdominal pain, abdominal distention and bowel incontinence.  Genitourinary: Negative.  Negative for bladder incontinence, dysuria, urgency, hematuria, vaginal bleeding, vaginal discharge, difficulty urinating, vaginal pain and pelvic pain.  Musculoskeletal: Positive for back pain. Negative for joint swelling.  Neurological: Positive for weakness (R thigh). Negative for dizziness, syncope and light-headedness. Numbness: R thigh.  Hematological: Negative for adenopathy. Does not bruise/bleed easily (in the past. ).  Psychiatric/Behavioral: Negative for hallucinations, confusion and agitation.    Allergies  Review of patient's allergies indicates no known allergies.  Home Medications   Current Outpatient Rx  Name Route Sig Dispense Refill  . CITALOPRAM HYDROBROMIDE 20 MG PO TABS Oral Take 20 mg by mouth daily.      Marland Kitchen CONJ ESTROG-MEDROXYPROGEST ACE 0.3-1.5 MG PO TABS Oral Take 1 tablet by mouth daily.      Marland Kitchen GABAPENTIN 800 MG PO TABS Oral Take 800 mg by mouth 3 (three) times daily.      Marland Kitchen LISINOPRIL-HYDROCHLOROTHIAZIDE 20-25 MG PO TABS Oral Take 1 tablet by mouth daily.      Marland Kitchen ZOLPIDEM TARTRATE 10 MG PO TABS Oral Take 10 mg by mouth at bedtime as needed.        BP 133/85  Pulse 88  Temp(Src) 98.3 F (36.8 C) (Oral)  Resp 16  Ht 5\' 3"  (1.6  m)  Wt 157 lb (71.215 kg)  BMI 27.81 kg/m2  SpO2 98%  Physical Exam  Nursing note and vitals reviewed. Constitutional: She is oriented to person, place, and time. She appears well-developed and well-nourished. She is cooperative.  Non-toxic appearance. She does not have a sickly appearance. She appears distressed (mildly uncomfortable from low back pain).  HENT:  Head: Normocephalic and atraumatic.  Mouth/Throat: Oropharynx is clear and moist.       Right hearing aid  Eyes: EOM are normal. Pupils are equal, round, and  reactive to light. Right conjunctiva has no hemorrhage. Left conjunctiva has no hemorrhage. No scleral icterus.  Neck: Normal range of motion and phonation normal. Neck supple. No JVD present. No tracheal tenderness present. No rigidity. No tracheal deviation and no edema present. No thyromegaly present.  Cardiovascular: Regular rhythm, S1 normal, S2 normal and normal heart sounds.  Exam reveals no gallop and no friction rub.   No murmur heard. Pulmonary/Chest: Effort normal and breath sounds normal. No respiratory distress. She has no wheezes. She has no rales. She exhibits no tenderness.  Abdominal: Soft. Bowel sounds are normal. She exhibits no distension, no pulsatile midline mass and no mass. There is no tenderness. There is no rebound and no guarding.  Genitourinary:       Deferred to gyn (recent gyn exam within past few months, per patient)  Musculoskeletal:       Right hip: Normal.       Left hip: Normal.       Cervical back: She exhibits no tenderness.       Thoracic back: She exhibits no tenderness.       Lumbar back: She exhibits decreased range of motion, tenderness (Right lumbar, buttock), bony tenderness and spasm. She exhibits no swelling, no edema, no deformity, no laceration and normal pulse.       Back:       + Right straight leg-raise test. Negative Left straight leg-raise test.  + Right Luisa Hart test. Negative Left Luisa Hart test.    Neurological: She is alert and oriented to person, place, and time. She has normal strength. She displays no atrophy, no tremor and normal reflexes. No cranial nerve deficit or sensory deficit. She exhibits normal muscle tone. Gait normal.  Reflex Scores:      Patellar reflexes are 2+ on the right side and 2+ on the left side.      Achilles reflexes are 2+ on the right side and 2+ on the left side. Skin: Skin is warm, dry and intact. No lesion and no rash noted.       No vesicles or lesions.  Psychiatric: She has a normal mood and affect.     ED Course  Procedures (including critical care time)  Labs: UA within normal limits except small amount blood. No protein. CBC: WBC=10.1, 32.7 % lymph's (nl), (absolute lymph ct=3.3, nl<1.9).  Granulocyte % is 60.5%, abs ct =6.1, within normal limits. Hgb= 13.3     Platelets nl at 283,000   Xray R hip and Xray LS spine ordered.  MASSEY,DAVID BARNETT  *RADIOLOGY REPORTS: reviewed with patient:  LUMBAR SPINE - COMPLETE 4+ VIEW IMPRESSION: No evidence of acute abnormality.   RIGHT HIP - COMPLETE 2+ VIEW Findings: No evidence of acute fracture, subluxation or dislocation identified. IMPRESSION: Unremarkable right hip  MDM  Likely dx is contusion and strain of lumbar back. May have mild radiculitis. However, neurological exam is intact. I have no explanation for bruising, other than contusion.  No other symptoms to suggest bleeding disorder. CBC within normal limits except for mildly high absolute lymph ct. I explained this to pt, gave her written instructions (see those instructions for details), will f/u with PCP and orthopedist. Over 45 min. Spent with pt today. Options discussed. Risks, benefits, alternatives discussed. Patient voiced understanding and agreement.        Lonell Face, MD 04/29/11 [phone]

## 2011-05-20 NOTE — Progress Notes (Signed)
Summary: LEFT ANKLE PAIN rm 5   Vital Signs:  Patient Profile:   50 Years Old Female CC:      LT ankle injury x 4wks ago Height:     63 inches Weight:      155.25 pounds O2 Sat:      98 % O2 treatment:    Room Air Temp:     98.5 degrees F oral Pulse rate:   71 / minute Resp:     16 per minute BP sitting:   107 / 63  (left arm) Cuff size:   regular  Pt. in pain?   yes    Location:   LT ankle    Intensity:   7    Type:       sharp/throbbing  Vitals Entered By: Clemens Catholic LPN (Oct 26, 2010 11:28 AM)                   Updated Prior Medication List: GABAPENTIN 800 MG TABS (GABAPENTIN) Take 1 tablet by mouth at bedtime HYDROCHLOROTHIAZIDE 25 MG TABS (HYDROCHLOROTHIAZIDE)  Birth control pills Current Allergies: No known allergies History of Present Illness Chief Complaint: LT ankle injury x 4wks ago History of Present Illness: L ankle pain for a month.  She was working out in her yard, twisted her foot/ankle and felt a pop.  She put herself on crutches for about a week, took Ibu 800 and tried to take it easy as well as using an OTC ankle brace.  It's still painful and occasionally still swells up on her.  Had some bruising but that went away.  Pain is mostly on the outside with some radiation up her leg and into her foot.  REVIEW OF SYSTEMS Constitutional Symptoms      Denies fever, chills, night sweats, weight loss, weight gain, and fatigue.  Eyes       Denies change in vision, eye pain, eye discharge, glasses, contact lenses, and eye surgery. Ear/Nose/Throat/Mouth       Denies hearing loss/aids, change in hearing, ear pain, ear discharge, dizziness, frequent runny nose, frequent nose bleeds, sinus problems, sore throat, hoarseness, and tooth pain or bleeding.  Respiratory       Denies dry cough, productive cough, wheezing, shortness of breath, asthma, bronchitis, and emphysema/COPD.  Cardiovascular       Denies murmurs, chest pain, and tires easily with exhertion.      Gastrointestinal       Denies stomach pain, nausea/vomiting, diarrhea, constipation, blood in bowel movements, and indigestion. Genitourniary       Denies painful urination, kidney stones, and loss of urinary control. Neurological       Complains of numbness and tingling.      Denies paralysis, seizures, and fainting/blackouts. Musculoskeletal       Complains of muscle pain and joint pain.      Denies joint stiffness, decreased range of motion, redness, swelling, muscle weakness, and gout.  Skin       Denies bruising, unusual mles/lumps or sores, and hair/skin or nail changes.  Psych       Denies mood changes, temper/anger issues, anxiety/stress, speech problems, depression, and sleep problems. Other Comments: pt c/o LT ankle injury x 4wks ago, she twisted it while mowing her yard. pain is radiating up to her knee and causing her toe to go numb sometimes.  she has kept it wrapped, iced, elevated and IBF with no relied=f.   Past History:  Past Medical History: Hypertension bipolar  Past Surgical History: Caesarean section fractured RT leg  Family History: none  Social History: Current Smoker 10 a day Alcohol use-yes 4-6 q wk Drug use-no Smoking Status:  current Drug Use:  no Physical Exam General appearance: well developed, well nourished, no acute distress.  patient is partially deaf Head: normocephalic, atraumatic MSE: oriented to time, place, and person L ankle: FROM, full strength.  +TTP ATFL, lateral malleolus, and mildly on peroneal tendon posterior to the malleolus.  No TTP medial malleolus, navicular, base of 5th, calcaneus, Achilles, or proximal fibula.  + swelling lateral malleolus.  No ecchymoses. Distal NV status intact.  Mild antalgic gait. Assessment New Problems: UNSPECIFIED CLOSED FRACTURE OF ANKLE (ICD-824.8) HYPERTENSION (ICD-401.9) ANKLE PAIN, LEFT (ICD-719.47)   Plan New Medications/Changes: TRAMADOL HCL 50 MG TABS (TRAMADOL HCL) 1 by mouth q6  hrs as needed for pain  #24 x 0, 10/26/2010, Hoyt Koch MD  New Orders: New Patient Level III [99203] T-DG Ankle Complete*L* [73610] Orthopedic Referral [Ortho] Crutches [E0110] Planning Comments:   Xray obtained and shows isolated vertical left ankle posterior malleolar fracture which probably goes into the joint.  Encourage ice, rest, elevation. NWB on crutches until then.  Referral to orthopedic surgery.   The patient and/or caregiver has been counseled thoroughly with regard to medications prescribed including dosage, schedule, interactions, rationale for use, and possible side effects and they verbalize understanding.  Diagnoses and expected course of recovery discussed and will return if not improved as expected or if the condition worsens. Patient and/or caregiver verbalized understanding.  Prescriptions: TRAMADOL HCL 50 MG TABS (TRAMADOL HCL) 1 by mouth q6 hrs as needed for pain  #24 x 0   Entered and Authorized by:   Hoyt Koch MD   Signed by:   Hoyt Koch MD on 10/26/2010   Method used:   Print then Give to Patient   RxID:   [id number]   Orders Added: 1)  New Patient Level III [14782] 2)  T-DG Ankle Complete*L* [73610] 3)  Orthopedic Referral [Ortho] 4)  Crutches [E0110]

## 2013-09-01 ENCOUNTER — Emergency Department (HOSPITAL_COMMUNITY)
Admission: EM | Admit: 2013-09-01 | Discharge: 2013-09-01 | Disposition: A | Discharge status: Home / Self Care | Payer: Medicare Other | Attending: Emergency Medicine | Admitting: Emergency Medicine

## 2013-09-01 ENCOUNTER — Encounter (HOSPITAL_COMMUNITY): Payer: Self-pay | Admitting: Emergency Medicine

## 2013-09-01 DIAGNOSIS — K1370 Unspecified lesions of oral mucosa: Secondary | ICD-10-CM | POA: Insufficient documentation

## 2013-09-01 DIAGNOSIS — H93299 Other abnormal auditory perceptions, unspecified ear: Secondary | ICD-10-CM | POA: Insufficient documentation

## 2013-09-01 DIAGNOSIS — Z9889 Other specified postprocedural states: Secondary | ICD-10-CM | POA: Insufficient documentation

## 2013-09-01 DIAGNOSIS — K130 Diseases of lips: Secondary | ICD-10-CM

## 2013-09-01 DIAGNOSIS — F17200 Nicotine dependence, unspecified, uncomplicated: Secondary | ICD-10-CM | POA: Insufficient documentation

## 2013-09-01 DIAGNOSIS — Z79899 Other long term (current) drug therapy: Secondary | ICD-10-CM | POA: Insufficient documentation

## 2013-09-01 DIAGNOSIS — I10 Essential (primary) hypertension: Secondary | ICD-10-CM | POA: Insufficient documentation

## 2013-09-01 MED ORDER — FLUOROURACIL 5 % EX CREA: TOPICAL_CREAM | Freq: Two times a day (BID) | CUTANEOUS | Status: DC

## 2013-09-01 MED ORDER — PREDNISONE 10 MG PO TABS: ORAL_TABLET | ORAL | Status: DC

## 2013-09-01 MED ORDER — KETOROLAC TROMETHAMINE 60 MG/2ML IM SOLN
60.0000 mg | Freq: Once | INTRAMUSCULAR | Status: AC
  Administered 2013-09-01: 60 mg via INTRAMUSCULAR
  Filled 2013-09-01: qty 2

## 2013-09-01 MED ORDER — METHYLPREDNISOLONE SODIUM SUCC 125 MG IJ SOLR
40.0000 mg | Freq: Once | INTRAMUSCULAR | Status: AC
Start: 2013-09-01 — End: 2013-09-01
  Administered 2013-09-01: 40 mg via INTRAMUSCULAR
  Filled 2013-09-01: qty 2

## 2013-09-01 MED ORDER — LIDOCAINE VISCOUS 2 % MT SOLN
15.0000 mL | Freq: Once | OROMUCOSAL | Status: AC
  Administered 2013-09-01: 15 mL via OROMUCOSAL
  Filled 2013-09-01: qty 15

## 2013-09-01 MED ORDER — LIDOCAINE VISCOUS 2 % MT SOLN
15.0000 mL | Freq: Once | OROMUCOSAL | Status: AC
  Administered 2013-09-01: 15 mL via OROMUCOSAL
  Filled 2013-09-01 (×2): qty 15

## 2013-09-01 NOTE — Discharge Instructions (Signed)
Sore or Dry Mouth Care A sore or dry mouth may happen for many different reasons. Sometimes, treatment for other health problems may have to stop until your sore or dry mouth gets better.  HOME CARE  Do not smoke or chew tobacco.  Use fake (artificial) saliva when your mouth feels dry.  Use a humidifier in your bedroom at night.  Eat small meals and snacks.  Eat food cold or at room temperature.  Suck on ice-chips or try frozen ice pops or juice bars, ice-cream, and watermelon. Do not have citrus flavors.  Suck on hard, sugarless, sour candy, or chew sugarless gum to help make more saliva.  Eat soft foods such as yogurt, bananas, canned fruit, mashed potatoes, oatmeal, rice, eggs, cottage cheese, macaroni and cheese, jello, and pudding.  Microwave vegetables and fruits to soften them.  Puree cooked food in a blender if needed.  Make dry food moist by using olive oil, gravy, or mild sauces. Dip foods in liquids.  Keep a glass of water or squirt bottle nearby. Take sips often throughout the day.  Limit caffeine.  Avoid:  Pop or fizzy drinks.  Alcohol.  Citrus juices.  Acidic food.  Salty or spicy food.  Foods or drinks that are very hot.  Hard or crunchy food. Mouth Care  Wash your hands well with soap and water before doing mouth care.  Use fake saliva as told by your doctor.  Use medicine on the sore places.  Brush your teeth at least 2 times a day. Brush after each meal if possible. Rinse your mouth with water after each meal and after drinking a sweet drink.  Brush slowly and gently in small circles. Do not brush side-to-side.  Use regular toothpastes, but stay away from ones that have sodium laurel sulfate in them.  Gargle with a baking soda mouthwash ( teaspoon baking soda mixed in with 4 cups of water).  Gargle with medicated mouthwash.  Use dental floss or dental tape to clean between your teeth every day.  Use a lanolin-based lip balm to keep  your lips from getting dry.  If you wear dentures or bridges:  You     may need to leave them out until your doctor tells you to start wearing them again.  Take them out at night if you wear them daily. Soak them in warm water or denture solution. Take your dentures out as much as you can during the day. Take them out when you use mouthwash.  After each meal, brush your gums gently with a soft brush and rinse your mouth with water.  If your dentures rub on your gums and cause a sore spot, have your dentist check and fix your dentures right away. GET HELP RIGHT AWAY IF:   Your mouth gets more painful or dry.  You have questions. MAKE SURE YOU:  Understand these instructions.  Will watch your condition.  Will get help right away if you are not doing well or get worse. Document Released: 03/31/2009 Document Revised: 08/26/2011 Document Reviewed: 03/31/2009 [facility] Patient Information 2014 [place]ExitCare, [place].    Follow-up with dermatology Take medicine as prescribed Return if symptoms worsen or do not improve

## 2013-09-01 NOTE — ED Notes (Signed)
Pt presents to department for evaluation of mouth pain. States lips are cracking, bleeding and painful. Ongoing for several weeks. 10/10 pain at the time. Respirations unlabored. Pt is alert and oriented x4.

## 2013-09-01 NOTE — ED Notes (Signed)
PT ambulated with baseline gait; VSS; A&Ox3; no signs of distress; respirations even and unlabored; skin warm and dry; no questions upon discharge.  

## 2013-09-14 ENCOUNTER — Encounter (HOSPITAL_COMMUNITY): Payer: Self-pay | Admitting: Emergency Medicine

## 2013-09-14 ENCOUNTER — Emergency Department (HOSPITAL_COMMUNITY)
Admission: EM | Admit: 2013-09-14 | Discharge: 2013-09-14 | Disposition: A | Discharge status: Home / Self Care | Payer: Medicare Other | Attending: Emergency Medicine | Admitting: Emergency Medicine

## 2013-09-14 DIAGNOSIS — Z8669 Personal history of other diseases of the nervous system and sense organs: Secondary | ICD-10-CM | POA: Insufficient documentation

## 2013-09-14 DIAGNOSIS — F17200 Nicotine dependence, unspecified, uncomplicated: Secondary | ICD-10-CM | POA: Insufficient documentation

## 2013-09-14 DIAGNOSIS — Z79899 Other long term (current) drug therapy: Secondary | ICD-10-CM | POA: Insufficient documentation

## 2013-09-14 DIAGNOSIS — B009 Herpesviral infection, unspecified: Secondary | ICD-10-CM | POA: Insufficient documentation

## 2013-09-14 DIAGNOSIS — I10 Essential (primary) hypertension: Secondary | ICD-10-CM | POA: Insufficient documentation

## 2013-09-14 DIAGNOSIS — Z791 Long term (current) use of non-steroidal anti-inflammatories (NSAID): Secondary | ICD-10-CM | POA: Insufficient documentation

## 2013-09-14 MED ORDER — HYDROCODONE-ACETAMINOPHEN 5-325 MG PO TABS
1.0000 | ORAL_TABLET | Freq: Once | ORAL | Status: AC
  Administered 2013-09-14: 1 via ORAL
  Filled 2013-09-14: qty 1

## 2013-09-14 MED ORDER — HYDROCODONE-ACETAMINOPHEN 5-325 MG PO TABS: 1.0000 | ORAL_TABLET | ORAL | Status: DC | PRN

## 2013-09-14 MED ORDER — VALACYCLOVIR HCL 1 G PO TABS: 1000.0000 mg | ORAL_TABLET | Freq: Two times a day (BID) | ORAL | Status: AC

## 2013-09-14 NOTE — Discharge Instructions (Signed)
Cold Sore  A cold sore (fever blister) is a skin infection caused by a certain type of germ (virus). They are small sores filled with fluid that dry up and heal within 2 weeks. Cold sores form inside of the mouth or on the lips, gums, and other parts of the body. Cold sores can be easily passed (contagious) to other people. This can happen through close personal contact, such as kissing or sharing a drinking glass.  HOME CARE  · Only take medicine as told by your doctor. Do not use aspirin.  · Use a cotton-tip swab to put creams or gels on your sores.  · Do not touch sores or pick scabs. Wash your hands often. Do not touch your eyes without washing your hands first.  · Avoid kissing, oral sex, and sharing personal items until the sores heal.  · Put an ice pack on your sores for 10 15 minutes to ease discomfort.  · Avoid hot, cold, or salty foods. Eat a soft, bland diet. Use a straw to drink if it helps lessen pain.  · Keep sores clean and dry.  · Avoid the sun and limit stress if these things cause you to have sores. Apply sunscreen on your lips if the sun causes cold sores.  GET HELP IF:  · You have a fever or lasting symptoms for more than 2 3 days.  · You have a fever and your symptoms suddenly get worse.  · You have yellow-white fluid (not clear) coming from the sores.  · You have redness that is spreading.  · You have pain or irritation in your eye.  · You get sores on your genitals.  · Your sores do not heal within 2 weeks.  · You have a tough time fighting off sickness and infections (weakened immune system).  · You get cold sores often.  MAKE SURE YOU:   · Understand these instructions.  · Will watch your condition.  · Will get help right away if you are not doing well or get worse.  Document Released: 12/03/2011 Document Reviewed: 12/03/2011  ExitCare® Patient Information ©2014 [employer].

## 2013-09-14 NOTE — ED Notes (Signed)
Arrived by [facility] for pain to lips. Having bleeding, swelling and pain to lips x 6 weeks, no relief with cream that she was given. Airway intact.

## 2013-09-14 NOTE — ED Provider Notes (Signed)
CSN: [id number]     Arrival date & time 09/14/13  1024 History  This chart was scribed for Teressa Lower, NP working with Rolland Porter, MD by Quintella Reichert, ED Scribe. This patient was seen in room TR05C/TR05C and the patient's care was started at 10:33 AM.   Chief Complaint  Patient presents with  . Facial Pain    The history is provided by the patient. No language interpreter was used.    HPI Comments: Shelby Mcdonald is a 53 y.o. female who presents to the Emergency Department complaining of severe upper and lower lip pain that began 6 weeks ago.  Pt reports her lips have been cracking, bleeding, swelling and extremely painful to the point that she is unable to eat.  She was seen here 2 weeks ago for the same and and placed on anti-fungal cream and referred to dermatology.  She states the cream has not helped at all.  She has an appointment with a dermatologist in the middle of April but states she cannot wait that long due to her pain.  Pt has no PCP.   Past Medical History  Diagnosis Date  . Hypertension   . Hearing aid worn   . Hearing disorder     Past Surgical History  Procedure Laterality Date  . Cesarean section      Family History  Problem Relation Age of Onset  . Adopted: Yes    History  Substance Use Topics  . Smoking status: Current Every Day Smoker  . Smokeless tobacco: Not on file  . Alcohol Use: No    OB History   Grav Para Term Preterm Abortions TAB SAB Ect Mult Living                   Review of Systems  HENT:       Pain, swelling and bleeding to upper and lower lips  All other systems reviewed and are negative.      Allergies  Review of patient's allergies indicates no known allergies.  Home Medications   Current Outpatient Rx  Name  Route  Sig  Dispense  Refill  . bisoprolol-hydrochlorothiazide (ZIAC) 5-6.25 MG per tablet   Oral   Take 1 tablet by mouth daily.         . citalopram (CELEXA) 20 MG tablet   Oral   Take  20 mg by mouth daily.           . cyclobenzaprine (FLEXERIL) 10 MG tablet   Oral   Take 10 mg by mouth daily as needed for muscle spasms.         . divalproex (DEPAKOTE) 500 MG DR tablet   Oral   Take 500 mg by mouth 2 (two) times daily.         Marland Kitchen estrogen, conjugated,-medroxyprogesterone (PREMPRO) 0.3-1.5 MG per tablet   Oral   Take 1 tablet by mouth daily.           . fluorouracil (EFUDEX) 5 % cream   Topical   Apply topically 2 (two) times daily.   40 g   0   . gabapentin (NEURONTIN) 800 MG tablet   Oral   Take 800 mg by mouth 3 (three) times daily.           Marland Kitchen lisinopril-hydrochlorothiazide (PRINZIDE,ZESTORETIC) 20-25 MG per tablet   Oral   Take 1 tablet by mouth daily.           . naproxen (NAPROSYN) 500 MG  tablet   Oral   Take 500 mg by mouth 2 (two) times daily with a meal.         . predniSONE (DELTASONE) 10 MG tablet      Take 5 pills on day one, take 4 pills on day two, Take 3 pills on day three, Take 2 pills on day four, take 1 pill on day five.   15 tablet   0   . SUMAtriptan (IMITREX) 100 MG tablet   Oral   Take 100 mg by mouth every 2 (two) hours as needed for migraine or headache. May repeat in 2 hours if headache persists or recurs.         . topiramate (TOPAMAX) 25 MG capsule   Oral   Take 25 mg by mouth 2 (two) times daily.         . traZODone (DESYREL) 50 MG tablet   Oral   Take 50 mg by mouth daily as needed for sleep.         Marland Kitchen. venlafaxine (EFFEXOR) 37.5 MG tablet   Oral   Take 37.5 mg by mouth 2 (two) times daily.         Marland Kitchen. venlafaxine (EFFEXOR) 75 MG tablet   Oral   Take 75 mg by mouth 2 (two) times daily.          BP 124/78  Pulse 83  Temp(Src) 98.8 F (37.1 C) (Oral)  Resp 16  Physical Exam  Nursing note and vitals reviewed. Constitutional: She is oriented to person, place, and time. She appears well-developed and well-nourished. No distress.  HENT:  Head: Normocephalic and atraumatic.  Crusted  bleeding lip noted to bottom.  Redness and blistering noted to top lip.  No oral mucosal involvement.  Eyes: EOM are normal.  Neck: Neck supple. No tracheal deviation present.  Cardiovascular: Normal rate.   Pulmonary/Chest: Effort normal. No respiratory distress.  Musculoskeletal: Normal range of motion.  Neurological: She is alert and oriented to person, place, and time.  Skin: Skin is warm and dry.  Psychiatric: She has a normal mood and affect. Her behavior is normal.    ED Course  Procedures (including critical care time)  DIAGNOSTIC STUDIES:   COORDINATION OF CARE: 10:36 AM-Discussed treatment plan with pt at bedside and pt agreed to plan.     Labs Review Labs Reviewed - No data to display  Imaging Review No results found.   EKG Interpretation None      MDM   Final diagnoses:  Herpes simplex     No oral mucosa involvement noted:consistent with herpes and well as dried:considered stev[facility] j[place] although unlikely based on time   I personally performed the services described in this documentation, which was scribed in my presence. The recorded information has been reviewed and is accurate.   Teressa LowerVrinda Rukia Mcgillivray, NP 09/14/13 1056

## 2013-09-17 NOTE — ED Provider Notes (Signed)
Medical screening examination/treatment/procedure(s) were performed by non-physician practitioner and as supervising physician I was immediately available for consultation/collaboration.   EKG Interpretation None        Janki Dike, MD 09/17/13 1608 

## 2013-09-30 ENCOUNTER — Other Ambulatory Visit: Payer: Self-pay

## 2014-03-17 ENCOUNTER — Emergency Department (HOSPITAL_COMMUNITY): Payer: Medicare Other

## 2014-03-17 ENCOUNTER — Encounter (HOSPITAL_COMMUNITY): Payer: Self-pay | Admitting: Emergency Medicine

## 2014-03-17 ENCOUNTER — Emergency Department (HOSPITAL_COMMUNITY)
Admission: EM | Admit: 2014-03-17 | Discharge: 2014-03-17 | Disposition: A | Discharge status: Home / Self Care | Payer: Medicare Other | Attending: Emergency Medicine | Admitting: Emergency Medicine

## 2014-03-17 DIAGNOSIS — S0990XA Unspecified injury of head, initial encounter: Secondary | ICD-10-CM | POA: Diagnosis present

## 2014-03-17 DIAGNOSIS — S0011XA Contusion of right eyelid and periocular area, initial encounter: Secondary | ICD-10-CM | POA: Diagnosis not present

## 2014-03-17 DIAGNOSIS — Z8669 Personal history of other diseases of the nervous system and sense organs: Secondary | ICD-10-CM | POA: Insufficient documentation

## 2014-03-17 DIAGNOSIS — Z974 Presence of external hearing-aid: Secondary | ICD-10-CM | POA: Diagnosis not present

## 2014-03-17 DIAGNOSIS — Z79899 Other long term (current) drug therapy: Secondary | ICD-10-CM | POA: Diagnosis not present

## 2014-03-17 DIAGNOSIS — I10 Essential (primary) hypertension: Secondary | ICD-10-CM | POA: Insufficient documentation

## 2014-03-17 DIAGNOSIS — Z720 Tobacco use: Secondary | ICD-10-CM | POA: Insufficient documentation

## 2014-03-17 DIAGNOSIS — S0511XA Contusion of eyeball and orbital tissues, right eye, initial encounter: Secondary | ICD-10-CM

## 2014-03-17 MED ORDER — MORPHINE SULFATE 4 MG/ML IJ SOLN
4.0000 mg | Freq: Once | INTRAMUSCULAR | Status: AC
  Administered 2014-03-17: 4 mg via INTRAVENOUS
  Filled 2014-03-17: qty 1

## 2014-03-17 MED ORDER — MORPHINE SULFATE 4 MG/ML IJ SOLN
8.0000 mg | Freq: Once | INTRAMUSCULAR | Status: DC
Start: 2014-03-17 — End: 2014-03-17
  Filled 2014-03-17: qty 2

## 2014-03-17 MED ORDER — IBUPROFEN 800 MG PO TABS
800.0000 mg | ORAL_TABLET | Freq: Once | ORAL | Status: AC
Start: 2014-03-17 — End: 2014-03-17
  Administered 2014-03-17: 800 mg via ORAL
  Filled 2014-03-17: qty 1

## 2014-03-17 MED ORDER — FLUORESCEIN SODIUM 1 MG OP STRP
1.0000 | ORAL_STRIP | Freq: Once | OPHTHALMIC | Status: AC
  Administered 2014-03-17: 12:00:00 via OPHTHALMIC
  Filled 2014-03-17: qty 1

## 2014-03-17 MED ORDER — TETRACAINE HCL 0.5 % OP SOLN
1.0000 [drp] | Freq: Once | OPHTHALMIC | Status: AC
  Administered 2014-03-17: 1 [drp] via OPHTHALMIC
  Filled 2014-03-17: qty 2

## 2014-03-17 MED ORDER — METOCLOPRAMIDE HCL 5 MG/ML IJ SOLN
10.0000 mg | Freq: Once | INTRAMUSCULAR | Status: AC
  Administered 2014-03-17: 10 mg via INTRAVENOUS
  Filled 2014-03-17: qty 2

## 2014-03-17 MED ORDER — HYDROCODONE-ACETAMINOPHEN 5-325 MG PO TABS: 2.0000 | ORAL_TABLET | ORAL | Status: AC | PRN

## 2014-03-17 MED ORDER — MORPHINE SULFATE 4 MG/ML IJ SOLN
4.0000 mg | Freq: Once | INTRAMUSCULAR | Status: AC
  Administered 2014-03-17: 4 mg via INTRAVENOUS

## 2014-03-17 MED ORDER — DIPHENHYDRAMINE HCL 50 MG/ML IJ SOLN
25.0000 mg | Freq: Once | INTRAMUSCULAR | Status: AC
Start: 2014-03-17 — End: 2014-03-17
  Administered 2014-03-17: 25 mg via INTRAVENOUS
  Filled 2014-03-17: qty 1

## 2014-03-17 MED ORDER — SODIUM CHLORIDE 0.9 % IV BOLUS (SEPSIS)
1000.0000 mL | Freq: Once | INTRAVENOUS | Status: AC
  Administered 2014-03-17: 1000 mL via INTRAVENOUS
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
## 2014-03-17 NOTE — ED Notes (Signed)
Returned from  X-ray
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
